# Patient Record
Sex: Female | Born: 1980 | Race: Black or African American | Hispanic: No | Smoking: Former smoker
Health system: Southern US, Community
[De-identification: ages and names within clinical notes are randomized; demographics above are authoritative.]

## PROBLEM LIST (undated history)

## (undated) HISTORY — PX: FOOT SURGERY: SHX648

## (undated) HISTORY — PX: OTHER SURGICAL HISTORY: SHX169

---

## 2000-06-26 ENCOUNTER — Ambulatory Visit (HOSPITAL_COMMUNITY): Admission: RE | Admit: 2000-06-26 | Discharge: 2000-06-26 | Payer: Self-pay | Admitting: *Deleted

## 2000-08-26 ENCOUNTER — Encounter: Payer: Self-pay | Admitting: Obstetrics

## 2000-08-26 ENCOUNTER — Inpatient Hospital Stay (HOSPITAL_COMMUNITY): Admission: AD | Admit: 2000-08-26 | Discharge: 2000-08-30 | Payer: Self-pay | Admitting: *Deleted

## 2001-05-13 ENCOUNTER — Emergency Department (HOSPITAL_COMMUNITY): Admission: EM | Admit: 2001-05-13 | Discharge: 2001-05-13 | Payer: Self-pay | Admitting: Emergency Medicine

## 2001-05-14 ENCOUNTER — Emergency Department (HOSPITAL_COMMUNITY): Admission: EM | Admit: 2001-05-14 | Discharge: 2001-05-14 | Payer: Self-pay | Admitting: Emergency Medicine

## 2006-10-05 ENCOUNTER — Emergency Department (HOSPITAL_COMMUNITY): Admission: EM | Admit: 2006-10-05 | Discharge: 2006-10-05 | Payer: Self-pay | Admitting: Emergency Medicine

## 2007-10-18 ENCOUNTER — Ambulatory Visit (HOSPITAL_COMMUNITY): Admission: RE | Admit: 2007-10-18 | Discharge: 2007-10-18 | Payer: Self-pay | Admitting: Obstetrics & Gynecology

## 2007-11-09 ENCOUNTER — Inpatient Hospital Stay (HOSPITAL_COMMUNITY): Admission: AD | Admit: 2007-11-09 | Discharge: 2007-11-11 | Payer: Self-pay | Admitting: Gynecology

## 2007-11-09 ENCOUNTER — Ambulatory Visit: Payer: Self-pay | Admitting: Obstetrics and Gynecology

## 2011-07-03 LAB — CBC
HCT: 36.2
Hemoglobin: 11.8 — ABNORMAL LOW
MCHC: 32.7
MCV: 78.7
Platelets: 256
RBC: 4.6
RDW: 14.8
WBC: 9.2

## 2011-10-03 ENCOUNTER — Other Ambulatory Visit: Payer: Self-pay | Admitting: Pediatrics

## 2012-02-10 ENCOUNTER — Encounter (HOSPITAL_COMMUNITY): Payer: Self-pay | Admitting: *Deleted

## 2012-02-10 ENCOUNTER — Emergency Department (HOSPITAL_COMMUNITY)
Admission: EM | Admit: 2012-02-10 | Discharge: 2012-02-10 | Disposition: A | Discharge status: Home / Self Care | Payer: Medicaid Other | Attending: Emergency Medicine | Admitting: Emergency Medicine

## 2012-02-10 DIAGNOSIS — R509 Fever, unspecified: Secondary | ICD-10-CM | POA: Insufficient documentation

## 2012-02-10 DIAGNOSIS — J020 Streptococcal pharyngitis: Secondary | ICD-10-CM

## 2012-02-10 DIAGNOSIS — F17200 Nicotine dependence, unspecified, uncomplicated: Secondary | ICD-10-CM | POA: Insufficient documentation

## 2012-02-10 MED ORDER — PENICILLIN V POTASSIUM 500 MG PO TABS: 500.0000 mg | ORAL_TABLET | Freq: Four times a day (QID) | ORAL | Status: AC

## 2012-02-10 MED ORDER — NAPROXEN 500 MG PO TABS: 500.0000 mg | ORAL_TABLET | Freq: Two times a day (BID) | ORAL | Status: DC

## 2012-02-10 NOTE — Discharge Instructions (Signed)
Plenty of fluids.   Follow up if not improving °

## 2012-02-10 NOTE — ED Provider Notes (Signed)
History     CSN: [id number]  Arrival date & time 02/10/12  1022   First MD Initiated Contact with Patient 02/10/12 1059      Chief Complaint  Patient presents with  . Fever    (Consider location/radiation/quality/duration/timing/severity/associated sxs/prior treatment) Patient is a 31 y.o. female presenting with fever. The history is provided by the patient (pt complains of a sorethroat). No language interpreter was used.  Fever Primary symptoms of the febrile illness include fever. Primary symptoms do not include fatigue, headaches, cough, abdominal pain, diarrhea or rash. The current episode started 2 days ago. This is a new problem. The problem has not changed since onset. Associated with: nothing. Risk factors: nothing.   History reviewed. No pertinent past medical history.  Past Surgical History  Procedure Date  . Foot surgery   . Cesearan section     No family history on file.  History  Substance Use Topics  . Smoking status: Current Some Day Smoker  . Smokeless tobacco: Not on file  . Alcohol Use: Yes     occ    OB History    Grav Para Term Preterm Abortions TAB SAB Ect Mult Living                  Review of Systems  Constitutional: Positive for fever. Negative for fatigue.  HENT: Positive for sore throat. Negative for congestion, sinus pressure and ear discharge.   Eyes: Negative for discharge.  Respiratory: Negative for cough.   Cardiovascular: Negative for chest pain.  Gastrointestinal: Negative for abdominal pain and diarrhea.  Genitourinary: Negative for frequency and hematuria.  Musculoskeletal: Negative for back pain.  Skin: Negative for rash.  Neurological: Negative for seizures and headaches.  Hematological: Negative.   Psychiatric/Behavioral: Negative for hallucinations.    Allergies  Review of patient's allergies indicates no known allergies.  Home Medications   Current Outpatient Rx  Name Route Sig Dispense Refill  . ACETAMINOPHEN  500 MG PO TABS Oral Take 500 mg by mouth every 6 (six) hours as needed. For fever    . NORETHIN ACE-ETH ESTRAD-FE 1-20 MG-MCG(24) PO TABS Oral Take 1 tablet by mouth daily at 12 noon.    Marland Kitchen NAPROXEN 500 MG PO TABS Oral Take 1 tablet (500 mg total) by mouth 2 (two) times daily. 14 tablet 0  . PENICILLIN V POTASSIUM 500 MG PO TABS Oral Take 1 tablet (500 mg total) by mouth 4 (four) times daily. 28 tablet 0    BP 114/73  Pulse 105  Temp(Src) 99.2 F (37.3 C) (Oral)  Resp 22  SpO2 100%  Physical Exam  Constitutional: She is oriented to person, place, and time. She appears well-developed.  HENT:  Head: Normocephalic and atraumatic.       pharnx mildly inflamed  Eyes: Conjunctivae and EOM are normal. No scleral icterus.  Neck: Neck supple. No thyromegaly present.  Cardiovascular: Normal rate and regular rhythm.  Exam reveals no gallop and no friction rub.   No murmur heard. Pulmonary/Chest: No stridor. She has no wheezes. She has no rales. She exhibits no tenderness.  Abdominal: She exhibits no distension. There is no tenderness. There is no rebound.  Musculoskeletal: Normal range of motion. She exhibits no edema.  Lymphadenopathy:    She has no cervical adenopathy.  Neurological: She is oriented to person, place, and time. Coordination normal.  Skin: No rash noted. No erythema.  Psychiatric: She has a normal mood and affect. Her behavior is normal.  ED Course  Procedures (including critical care time)  Labs Reviewed  RAPID STREP SCREEN - Abnormal; Notable for the following:    Streptococcus, Group A Screen (Direct) POSITIVE (*)    All other components within normal limits   No results found.   1. Strep pharyngitis       MDM          Benny Lennert, MD 02/10/12 [phone]

## 2012-02-10 NOTE — ED Notes (Signed)
Pt is here with temp 103 and she took one tylenol, sorethroat and body aches.

## 2012-02-10 NOTE — ED Notes (Signed)
Patient states sore throat 4/10 hurts to swallow airway intact bilateral equal chest rise and fall.  States general body achy.  Took one tablet tylenol prior to arrival unsure of dose.

## 2012-07-08 ENCOUNTER — Other Ambulatory Visit: Payer: Self-pay | Admitting: Family Medicine

## 2012-09-06 ENCOUNTER — Emergency Department (HOSPITAL_COMMUNITY)
Admission: EM | Admit: 2012-09-06 | Discharge: 2012-09-07 | Disposition: A | Discharge status: Home / Self Care | Payer: Medicaid Other | Attending: Emergency Medicine | Admitting: Emergency Medicine

## 2012-09-06 ENCOUNTER — Encounter (HOSPITAL_COMMUNITY): Payer: Self-pay | Admitting: *Deleted

## 2012-09-06 DIAGNOSIS — T7840XA Allergy, unspecified, initial encounter: Secondary | ICD-10-CM

## 2012-09-06 DIAGNOSIS — L272 Dermatitis due to ingested food: Secondary | ICD-10-CM | POA: Insufficient documentation

## 2012-09-06 DIAGNOSIS — R209 Unspecified disturbances of skin sensation: Secondary | ICD-10-CM | POA: Insufficient documentation

## 2012-09-06 DIAGNOSIS — F17200 Nicotine dependence, unspecified, uncomplicated: Secondary | ICD-10-CM | POA: Insufficient documentation

## 2012-09-06 NOTE — ED Notes (Signed)
The pt has had small hives since this am.  Tonight the hives have enlarged and spreading all over her body.  No resp diff

## 2012-09-07 MED ORDER — DIPHENHYDRAMINE HCL 25 MG PO TABS: 25.0000 mg | ORAL_TABLET | Freq: Four times a day (QID) | ORAL | Status: AC

## 2012-09-07 MED ORDER — FAMOTIDINE 20 MG PO TABS
40.0000 mg | ORAL_TABLET | Freq: Once | ORAL | Status: AC
  Administered 2012-09-07: 40 mg via ORAL
  Filled 2012-09-07: qty 2

## 2012-09-07 MED ORDER — DIPHENHYDRAMINE HCL 25 MG PO CAPS
25.0000 mg | ORAL_CAPSULE | Freq: Once | ORAL | Status: AC
  Administered 2012-09-07: 25 mg via ORAL
  Filled 2012-09-07: qty 1

## 2012-09-07 MED ORDER — PREDNISONE 20 MG PO TABS
60.0000 mg | ORAL_TABLET | Freq: Once | ORAL | Status: AC
  Administered 2012-09-07: 60 mg via ORAL
  Filled 2012-09-07: qty 3

## 2012-09-07 MED ORDER — FAMOTIDINE 20 MG PO TABS: 20.0000 mg | ORAL_TABLET | Freq: Two times a day (BID) | ORAL | Status: DC

## 2012-09-07 MED ORDER — HYDROCORTISONE 1 % EX CREA: TOPICAL_CREAM | CUTANEOUS | Status: DC

## 2012-09-07 NOTE — ED Provider Notes (Signed)
History     CSN: [id number]  Arrival date & time 09/06/12  2325   First MD Initiated Contact with Patient 09/07/12 0010      Chief Complaint  Patient presents with  . Urticaria    (Consider location/radiation/quality/duration/timing/severity/associated sxs/prior treatment) HPI  31 year old female presents for evaluations of hives. Patient reports this morning she noticed a small hives on her right forearm, itchy, red.  Throughout the day she noticed more hives on her body.  Hives appears on her finger, her upper chest, and also on her back.  She denies fever, headache, throat swelling, cp, sob, or n/v/d.  She recall having hives when eating tomato in the past.  She did eat tomato last night and spaghetti today with tomato sauce.  She denies any medication changes, changes in soap, detergent, new pets, or insect bite.  No other significant hx of allergy.  No treatment tried.    History reviewed. No pertinent past medical history.  Past Surgical History  Procedure Date  . Foot surgery   . Cesearan section     No family history on file.  History  Substance Use Topics  . Smoking status: Current Some Day Smoker  . Smokeless tobacco: Not on file  . Alcohol Use: Yes     Comment: occ    OB History    Grav Para Term Preterm Abortions TAB SAB Ect Mult Living                  Review of Systems  Constitutional: Negative for fever.  HENT: Negative for trouble swallowing.   Respiratory: Negative for chest tightness and shortness of breath.   Cardiovascular: Negative for chest pain and palpitations.  Gastrointestinal: Negative for abdominal pain.  Skin: Positive for rash (hives).  Neurological: Negative for numbness.    Allergies  Review of patient's allergies indicates no known allergies.  Home Medications   Current Outpatient Rx  Name  Route  Sig  Dispense  Refill  . ACETAMINOPHEN 500 MG PO TABS   Oral   Take 500 mg by mouth every 6 (six) hours as needed. For fever         . NAPROXEN 500 MG PO TABS   Oral   Take 1 tablet (500 mg total) by mouth 2 (two) times daily.   14 tablet   0   . NORETHIN ACE-ETH ESTRAD-FE 1-20 MG-MCG(24) PO TABS   Oral   Take 1 tablet by mouth daily at 12 noon.           BP 118/69  Pulse 74  Temp 98.6 F (37 C) (Oral)  Resp 18  SpO2 98%  Physical Exam  Nursing note and vitals reviewed. Constitutional: She is oriented to person, place, and time. She appears well-developed and well-nourished. No distress.  HENT:  Head: Atraumatic.  Mouth/Throat: Oropharynx is clear and moist.       No mucosa edema, no airway obstruction  Eyes: Conjunctivae normal are normal.  Neck: Normal range of motion. Neck supple.  Cardiovascular: Normal rate and regular rhythm.   Pulmonary/Chest: Effort normal and breath sounds normal. No respiratory distress. She has no wheezes. She exhibits no tenderness.  Abdominal: Soft. Bowel sounds are normal. There is no tenderness.  Musculoskeletal: Normal range of motion. She exhibits no edema.  Neurological: She is alert and oriented to person, place, and time.  Skin:       Circular area of erythema, induration and warmth suggestive of hives (R fingers, R forearm,  L forearm, upper chest, upper back).  No pustular, petechial, or vesicular lesions.   Psychiatric: She has a normal mood and affect.    ED Course  Procedures (including critical care time)  Labs Reviewed - No data to display No results found.   No diagnosis found.  1. Allergic reaction  MDM  Pt presents with skin changes suggestive of hives.  She has similar reaction from eating tomato before but not this significant.  She admits to eating tomato today.  Unsure if this is related to allergy to tomato.  No airway compromise, no signs of infection.  Doubt insect bite.    Prednisone/Benadryl/Pepcid given.  Pt recommend to f/u with PCP and with allergist for recheck and to determine if she is indeed allergic to tomato.  Strict  return precaution discussed.    12:43 AM Pt felt a bit better.  Stable to be discharge.    BP 118/69  Pulse 74  Temp 98.6 F (37 C) (Oral)  Resp 18  SpO2 98%       Fayrene Helper, PA-C 09/07/12 [phone]

## 2012-09-07 NOTE — ED Provider Notes (Signed)
Medical screening examination/treatment/procedure(s) were performed by non-physician practitioner and as supervising physician I was immediately available for consultation/collaboration.  Olivia Mackie, MD 09/07/12 [phone]

## 2014-10-26 ENCOUNTER — Encounter (HOSPITAL_COMMUNITY): Payer: Self-pay | Admitting: Emergency Medicine

## 2014-10-26 ENCOUNTER — Emergency Department (INDEPENDENT_AMBULATORY_CARE_PROVIDER_SITE_OTHER)
Admission: EM | Admit: 2014-10-26 | Discharge: 2014-10-26 | Disposition: A | Discharge status: Home / Self Care | Payer: Self-pay | Source: Home / Self Care | Attending: Family Medicine | Admitting: Family Medicine

## 2014-10-26 DIAGNOSIS — Z9109 Other allergy status, other than to drugs and biological substances: Secondary | ICD-10-CM

## 2014-10-26 DIAGNOSIS — J329 Chronic sinusitis, unspecified: Secondary | ICD-10-CM

## 2014-10-26 DIAGNOSIS — Z91048 Other nonmedicinal substance allergy status: Secondary | ICD-10-CM

## 2014-10-26 MED ORDER — IPRATROPIUM BROMIDE 0.06 % NA SOLN
2.0000 | Freq: Four times a day (QID) | NASAL | Status: DC
Start: 2014-10-26 — End: 2018-06-16

## 2014-10-26 MED ORDER — AMOXICILLIN-POT CLAVULANATE 875-125 MG PO TABS: 1.0000 | ORAL_TABLET | Freq: Two times a day (BID) | ORAL | Status: DC

## 2014-10-26 MED ORDER — FLUTICASONE PROPIONATE 50 MCG/ACT NA SUSP: 2.0000 | Freq: Every day | NASAL | Status: DC

## 2014-10-26 NOTE — ED Provider Notes (Signed)
CSN: [id number]     Arrival date & time 10/26/14  1109 History   First MD Initiated Contact with Patient 10/26/14 1136     Chief Complaint  Patient presents with  . Cough   (Consider location/radiation/quality/duration/timing/severity/associated sxs/prior Treatment) HPI   Cough started 7 days ago. Associated w/ runny nose. H/o sinus and allergy "problems." worse at night and first thing in the morning. Minimal mucus production. OTC cough medicine and tea w/o much benefit. Denies CP, SOb, fever, facial pain. HAs. Hurst to cough. 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 Decreased activity during this time due to decreased energy. [place]/ nausea and fevers and cold symptoms. Not getting better or worse.  Pt [facility] w/ benefit.    History reviewed. No pertinent past medical history. Past Surgical History  Procedure Laterality Date  . Foot surgery    . Cesearan section     Family History  Problem Relation Age of Onset  . Hypertension Father   . Thyroid disease Sister   . Hyperlipidemia Sister   . Diabetes Maternal Grandmother    History  Substance Use Topics  . Smoking status: Former Smoker    Quit date: 10/26/2013  . Smokeless tobacco: Not on file  . Alcohol Use: Yes     Comment: occ   OB History    No data available     Review of Systems Per HPI with all other pertinent systems negative.   Per HPI with all other pertinent systems negative.   Allergies  Review of patient's allergies indicates no known allergies.  Home Medications   Prior to Admission medications   Medication Sig Start Date End Date Taking? Authorizing Provider  norethindrone-ethinyl estradiol (JUNEL FE,GILDESS FE,LOESTRIN FE) 1-20 MG-MCG tablet Take 1 tablet by mouth daily.   Yes Historical Provider, MD  amoxicillin-clavulanate (AUGMENTIN) 875-125 MG per tablet Take 1 tablet by mouth 2 (two) times daily. 10/26/14   Ozella Rocksavid J Hallel Denherder, MD  diphenhydrAMINE (BENADRYL) 25 MG  tablet Take 1 tablet (25 mg total) by mouth every 6 (six) hours. 09/07/12   Fayrene HelperBowie Tran, PA-C  famotidine (PEPCID) 20 MG tablet Take 1 tablet (20 mg total) by mouth 2 (two) times daily. 09/07/12   Fayrene HelperBowie Tran, PA-C  fluticasone (FLONASE) 50 MCG/ACT nasal spray Place 2 sprays into both nostrils at bedtime. 10/26/14   Ozella Rocksavid J Laderius Valbuena, MD  hydrocortisone cream 1 % Apply to affected area 2 times daily 09/07/12   Fayrene HelperBowie Tran, PA-C  ibuprofen (ADVIL,MOTRIN) 200 MG tablet Take 400 mg by mouth every 6 (six) hours as needed. For pain    Historical Provider, MD  ipratropium (ATROVENT) 0.06 % nasal spray Place 2 sprays into both nostrils 4 (four) times daily. 10/26/14   Ozella Rocksavid J Arloa Prak, MD   BP 122/77 mmHg  Pulse 90  Temp(Src) 98.3 F (36.8 C) (Oral)  Resp 16  SpO2 100%  LMP 10/24/2014 (Exact Date) Physical Exam  Constitutional: She is oriented to person, place, and time. She appears well-developed and well-nourished.  HENT:  Tonsils 0+ Nasal congestion  Eyes: EOM are normal. Pupils are equal, round, and reactive to light.  Neck: Normal range of motion.  Cardiovascular: Normal rate and normal heart sounds.   Pulmonary/Chest: Effort normal and breath sounds normal.  Abdominal: Soft. Bowel sounds are normal.  Musculoskeletal: She exhibits no tenderness.  Neurological: She is alert and oriented to person, place, and time. No cranial nerve deficit. Coordination normal.  Skin: Skin is  warm.  Psychiatric: She has a normal mood and affect. Her behavior is normal. Judgment and thought content normal.    ED Course  Procedures (including critical care time) Labs Review Labs Reviewed - No data to display  Imaging Review No results found.   MDM   1. Rhinosinusitis   2. Multiple environmental allergies    Start nasal atrovent and flonase, OTC allergy medicine such as zyrtec, and ibuprofen Start Augmentin if symptoms persist more than a couple more days or develops fevers.  Precautions given and all  questions answered  Shelly Flatten, MD Family Medicine 10/26/2014, 11:52 AM      Ozella Rocks, MD 10/26/14 [phone]

## 2014-10-26 NOTE — ED Notes (Signed)
Pt complains of a cough that started January 8.  Due to the coughing she finds herself weak with activity and short of breath.  It has also caused aching in her back, shoulders, chest & head.  She reports a bad cold with a low grade fever over Christmas that went away for about a week.

## 2014-10-26 NOTE — Discharge Instructions (Signed)
Your symptoms are likely a combination of infection and allergies Please start the atrovent and flonase Consider starting an allergy pill and ibuprofen 400-600mg  every 4-6 hours Please start the antibiotics only if the above therapies do not improve your symtpoms over the next couple of days or if you develop worsening symtpoms with fevers.

## 2018-06-16 ENCOUNTER — Other Ambulatory Visit: Payer: Self-pay

## 2018-06-16 ENCOUNTER — Ambulatory Visit (HOSPITAL_COMMUNITY)
Admission: EM | Admit: 2018-06-16 | Discharge: 2018-06-16 | Disposition: A | Discharge status: Home / Self Care | Payer: Self-pay | Attending: Family Medicine | Admitting: Family Medicine

## 2018-06-16 ENCOUNTER — Encounter (HOSPITAL_COMMUNITY): Payer: Self-pay | Admitting: Emergency Medicine

## 2018-06-16 DIAGNOSIS — M79642 Pain in left hand: Secondary | ICD-10-CM

## 2018-06-16 DIAGNOSIS — R2232 Localized swelling, mass and lump, left upper limb: Secondary | ICD-10-CM

## 2018-06-16 DIAGNOSIS — T7840XA Allergy, unspecified, initial encounter: Secondary | ICD-10-CM

## 2018-06-16 MED ORDER — PREDNISONE 20 MG PO TABS: 40.0000 mg | ORAL_TABLET | Freq: Every day | ORAL | 0 refills | Status: AC

## 2018-06-16 NOTE — ED Triage Notes (Signed)
On Monday night/tuesday morning was asleep and woke with hand itching.  As Tuesday progressed left hand was itching, swollen, and red.  Most itching is in hand on the side of little finger.  Left hand is swollen and painful and red extending to above left wrist

## 2018-06-16 NOTE — Discharge Instructions (Signed)
5 days of oral prednisone to help with this response.  Daily zyrtec 10mg .  You  may use triamcinolone cream twice a day as needed for itching, apply primarily to left side of hand at point of suspected bite.  Ice, elevation to help with swelling.  If symptoms worsen or do not improve in the next week to return to be seen or to follow up with your PCP.

## 2018-06-16 NOTE — ED Provider Notes (Signed)
[facility]    CSN: [id number] Arrival date & time: 06/16/18  6295     History   Chief Complaint Chief Complaint  Patient presents with  . Insect Bite    HPI Lindsay Terry is a 37 y.o. female.   Lindsay Terry presents with complaints of left hand swelling, redness and itching. She woke with this yesterday morning, she had been sleeping on her couch. This morning redness, swelling and itching are more severe. No numbness or tingling. No pain. No known injury. Suspects bite as there is a raised area to left lateral pinky and symptoms have been focused surrounding this area. Swelling has extended to entire hand today however. Took one benadryl yesterday and has applied ice and kenalog cream which have minimally helped. No injury. Did not witness a bug bite specifically. She is right handed. She has had a plate and screws to her left wrist.    ROS per HPI.      History reviewed. No pertinent past medical history.  There are no active problems to display for this patient.   Past Surgical History:  Procedure Laterality Date  . arm surgery    . cesearan section    . FOOT SURGERY      OB History   None      Home Medications    Prior to Admission medications   Medication Sig Start Date End Date Taking? Authorizing Provider  diphenhydrAMINE (BENADRYL) 25 MG tablet Take 1 tablet (25 mg total) by mouth every 6 (six) hours. 09/07/12  Yes Fayrene Helper, PA-C  ibuprofen (ADVIL,MOTRIN) 200 MG tablet Take 400 mg by mouth every 6 (six) hours as needed. For pain    [provider]  norethindrone-ethinyl estradiol (JUNEL FE,GILDESS FE,LOESTRIN FE) 1-20 MG-MCG tablet Take 1 tablet by mouth daily.    [provider]  predniSONE (DELTASONE) 20 MG tablet Take 2 tablets (40 mg total) by mouth daily with breakfast for 5 days. 06/16/18 06/21/18  Georgetta Haber, NP    Family History Family History  Problem Relation Age of Onset  . Hypertension Father   . Thyroid  disease Sister   . Hyperlipidemia Sister   . Diabetes Maternal Grandmother     Social History Social History   Tobacco Use  . Smoking status: Former Smoker    Last attempt to quit: 10/26/2013    Years since quitting: 4.6  Substance Use Topics  . Alcohol use: Yes    Comment: occ  . Drug use: No     Allergies   Patient has no known allergies.   Review of Systems Review of Systems   Physical Exam Triage Vital Signs ED Triage Vitals  Enc Vitals Group     BP 06/16/18 0852 124/87     Pulse Rate 06/16/18 0852 79     Resp 06/16/18 0852 16     Temp 06/16/18 0852 98.2 F (36.8 C)     Temp Source 06/16/18 0852 Oral     SpO2 06/16/18 0852 98 %     Weight --      Height --      Head Circumference --      Peak Flow --      Pain Score 06/16/18 0903 0     Pain Loc --      Pain Edu? --      Excl. in GC? --    No data found.  Updated Vital Signs BP 124/87 (BP Location: Left Arm)  Pulse 79   Temp 98.2 F (36.8 C) (Oral)   Resp 16   LMP 05/23/2018   SpO2 98%    Physical Exam  Constitutional: She is oriented to person, place, and time. She appears well-developed and well-nourished. No distress.  HENT:  Mouth/Throat: Oropharynx is clear and moist.  Cardiovascular: Normal rate, regular rhythm and normal heart sounds.  Pulmonary/Chest: Effort normal and breath sounds normal. She has no wheezes.  Musculoskeletal:       Left wrist: Normal.       Left hand: She exhibits swelling. She exhibits normal range of motion, no tenderness, no bony tenderness, normal two-point discrimination, normal capillary refill, no deformity and no laceration. Normal sensation noted. Normal strength noted.       Hands: Raised area to left proximal pinky noted; left hand grossly swollen and with redness and warm; generalized; non tender; full ROM to wrist; fingers to fist slightly limited by swelling to proximal phalanges; gross sensation intact; strong pulse; cap refill < 2 seconds      Neurological: She is alert and oriented to person, place, and time.  Skin: Skin is warm and dry.     UC Treatments / Results  Labs (all labs ordered are listed, but only abnormal results are displayed) Labs Reviewed - No data to display  EKG None  Radiology No results found.  Procedures Procedures (including critical care time)  Medications Ordered in UC Medications - No data to display  Initial Impression / Assessment and Plan / UC Course  I have reviewed the triage vital signs and the nursing notes.  Pertinent labs & imaging results that were available during my care of the patient were reviewed by me and considered in my medical decision making (see chart for details).     Afebrile. No pain. Full ROM. Swelling redness and predominately itching to left hand. Appears consistent with insect bite and reaction. Prednisone course provided. Encouraged continued use of antihistamine, elevation, ice as well. Return precautions provided. Patient verbalized understanding and agreeable to plan.   Final Clinical Impressions(s) / UC Diagnoses   Final diagnoses:  Allergic reaction, initial encounter     Discharge Instructions     5 days of oral prednisone to help with this response.  Daily zyrtec 10mg .  You  may use triamcinolone cream twice a day as needed for itching, apply primarily to left side of hand at point of suspected bite.  Ice, elevation to help with swelling.  If symptoms worsen or do not improve in the next week to return to be seen or to follow up with your PCP.      ED Prescriptions    Medication Sig Dispense Auth. Provider   predniSONE (DELTASONE) 20 MG tablet Take 2 tablets (40 mg total) by mouth daily with breakfast for 5 days. 10 tablet Georgetta Haber, NP     Controlled Substance Prescriptions [place] Controlled Substance Registry consulted? Not Applicable   Georgetta Haber, NP 06/16/18 [phone]

## 2018-06-23 ENCOUNTER — Telehealth (HOSPITAL_COMMUNITY): Payer: Self-pay | Admitting: *Deleted

## 2018-06-23 NOTE — Telephone Encounter (Signed)
Telephoned patient at home number and left message to return call to [facility] 

## 2018-06-25 ENCOUNTER — Other Ambulatory Visit (HOSPITAL_COMMUNITY): Payer: Self-pay | Admitting: *Deleted

## 2018-06-25 ENCOUNTER — Encounter (HOSPITAL_COMMUNITY): Payer: Self-pay | Admitting: *Deleted

## 2018-06-25 DIAGNOSIS — N6320 Unspecified lump in the left breast, unspecified quadrant: Secondary | ICD-10-CM

## 2018-07-22 ENCOUNTER — Ambulatory Visit (HOSPITAL_COMMUNITY)
Admission: RE | Admit: 2018-07-22 | Discharge: 2018-07-22 | Disposition: A | Discharge status: Home / Self Care | Payer: Self-pay | Source: Ambulatory Visit | Attending: Obstetrics and Gynecology | Admitting: Obstetrics and Gynecology

## 2018-07-22 ENCOUNTER — Ambulatory Visit
Admission: RE | Admit: 2018-07-22 | Discharge: 2018-07-22 | Disposition: A | Discharge status: Home / Self Care | Payer: PRIVATE HEALTH INSURANCE | Source: Ambulatory Visit | Attending: Obstetrics and Gynecology | Admitting: Obstetrics and Gynecology

## 2018-07-22 ENCOUNTER — Encounter (HOSPITAL_COMMUNITY): Payer: Self-pay

## 2018-07-22 ENCOUNTER — Ambulatory Visit
Admission: RE | Admit: 2018-07-22 | Discharge: 2018-07-22 | Disposition: A | Discharge status: Home / Self Care | Payer: Self-pay | Source: Ambulatory Visit | Attending: Obstetrics and Gynecology | Admitting: Obstetrics and Gynecology

## 2018-07-22 VITALS — BP 122/84 | Wt 221.6 lb

## 2018-07-22 DIAGNOSIS — N6320 Unspecified lump in the left breast, unspecified quadrant: Secondary | ICD-10-CM

## 2018-07-22 DIAGNOSIS — N6324 Unspecified lump in the left breast, lower inner quadrant: Secondary | ICD-10-CM

## 2018-07-22 DIAGNOSIS — Z1239 Encounter for other screening for malignant neoplasm of breast: Secondary | ICD-10-CM

## 2018-07-22 NOTE — Progress Notes (Signed)
Patient referred to [facility] by the [facility] for a left breast lump x 2 months that patient states feels different.  Pap Smear: Pap smear not completed today. Last Pap smear was 05/11/2018 at the [facility] and normal. Per patient has no history of an abnormal Pap smear. Last Pap smear result is in Epic.  Physical exam: Breasts Breasts symmetrical. No skin abnormalities bilateral breasts. No nipple retraction bilateral breasts. No nipple discharge bilateral breasts. No lymphadenopathy. No lumps palpated right breast. Palpated a pea sized lump within the left breast at 7 o'clock 7 cm from the nipple. No complaints of pain or tenderness on exam. Referred patient to the [facility] for a diagnostic mammogram and left breast ultrasound. Appointment scheduled for Thursday, July 22, 2018 at 1410.        Pelvic/Bimanual No Pap smear completed today since last Pap smear was 05/11/2018. Pap smear not indicated per [facility] guidelines.   Smoking History: Patient is a former smoker that quit in 2015.  Patient Navigation: Patient education provided. Access to services provided for patient through [facility] program.   Breast and Cervical Cancer Risk Assessment: Patient has no family history of breast cancer, known genetic mutations, or radiation treatment to the chest before age 21. Patient has no history of cervical dysplasia, immunocompromised, or DES exposure in-utero.  Risk Assessment    Risk Scores      07/22/2018   Last edited by: Lynnell Dike, LPN   5-year risk: 0.4 %   Lifetime risk: 9.8 %

## 2018-07-22 NOTE — Patient Instructions (Signed)
Explained breast self awareness with Mikalyn A Delfino. Patient did not need a Pap smear today due to last Pap smear was 05/11/2018. Let her know [facility] will cover Pap smears every 3 years unless has a history of abnormal Pap smears. Referred patient to the [facility] for a diagnostic mammogram and left breast ultrasound. Appointment scheduled for Thursday, July 22, 2018 at 1410. Viola A Yamaguchi verbalized understanding.  Ahmon Tosi, Kathaleen Maser, RN 2:35 PM

## 2018-08-11 ENCOUNTER — Encounter (HOSPITAL_COMMUNITY): Payer: Self-pay | Admitting: *Deleted

## 2020-05-08 ENCOUNTER — Telehealth: Payer: Self-pay | Admitting: Family Medicine

## 2020-05-08 NOTE — Telephone Encounter (Signed)
Attempted to reach patient to schedule an appointment.

## 2020-08-27 ENCOUNTER — Telehealth: Payer: Self-pay

## 2020-08-27 NOTE — Telephone Encounter (Signed)
Patient left message on voicemail requesting return call regarding scheduling a [facility] appointment. Attempted to contact patient, left message on voicemail requesting a return call.

## 2021-03-20 ENCOUNTER — Other Ambulatory Visit: Payer: Self-pay | Admitting: Family Medicine

## 2021-03-20 DIAGNOSIS — Z1231 Encounter for screening mammogram for malignant neoplasm of breast: Secondary | ICD-10-CM

## 2021-05-08 ENCOUNTER — Other Ambulatory Visit: Payer: Self-pay | Admitting: Family Medicine

## 2021-05-08 DIAGNOSIS — N6320 Unspecified lump in the left breast, unspecified quadrant: Secondary | ICD-10-CM

## 2021-06-01 ENCOUNTER — Encounter: Payer: Self-pay | Admitting: Radiology

## 2021-06-12 ENCOUNTER — Other Ambulatory Visit: Payer: PRIVATE HEALTH INSURANCE

## 2021-07-12 ENCOUNTER — Ambulatory Visit
Admission: RE | Admit: 2021-07-12 | Discharge: 2021-07-12 | Disposition: A | Discharge status: Home / Self Care | Payer: PRIVATE HEALTH INSURANCE | Source: Ambulatory Visit | Attending: Family Medicine | Admitting: Family Medicine

## 2021-07-12 ENCOUNTER — Ambulatory Visit
Admission: RE | Admit: 2021-07-12 | Discharge: 2021-07-12 | Disposition: A | Discharge status: Home / Self Care | Payer: Medicaid Other | Source: Ambulatory Visit | Attending: Family Medicine | Admitting: Family Medicine

## 2021-07-12 ENCOUNTER — Other Ambulatory Visit: Payer: Self-pay

## 2021-07-12 DIAGNOSIS — N6320 Unspecified lump in the left breast, unspecified quadrant: Secondary | ICD-10-CM

## 2022-06-24 ENCOUNTER — Other Ambulatory Visit: Payer: Self-pay | Admitting: Family Medicine

## 2022-06-24 DIAGNOSIS — Z1231 Encounter for screening mammogram for malignant neoplasm of breast: Secondary | ICD-10-CM

## 2022-07-15 ENCOUNTER — Ambulatory Visit
Admission: RE | Admit: 2022-07-15 | Discharge: 2022-07-15 | Disposition: A | Discharge status: Home / Self Care | Payer: Medicaid Other | Source: Ambulatory Visit | Attending: Family Medicine | Admitting: Family Medicine

## 2022-07-15 DIAGNOSIS — Z1231 Encounter for screening mammogram for malignant neoplasm of breast: Secondary | ICD-10-CM

## 2023-03-17 IMAGING — MG DIGITAL DIAGNOSTIC BILAT W/ TOMO W/ CAD
6 of 10 series · 6 of 30 positions shown · non-contrast
Comparison: Mammography 07/22/2018.

CLINICAL DATA: 39-year-old presenting with a possible pea-sized
palpable lump at the 7-8 o'clock position of the LEFT breast on
recent clinical examination. The patient states that she does not
feel the lump. Annual evaluation, RIGHT breast.

She states that she fell onto a hardwood floor proximally 2 years
ago and injured the inner LEFT breast.
EXAM:
DIGITAL DIAGNOSTIC BILATERAL MAMMOGRAM WITH TOMOSYNTHESIS AND CAD;
ULTRASOUND LEFT BREAST LIMITED
TECHNIQUE: Bilateral digital diagnostic mammography and breast tomosynthesis
was performed. The images were evaluated with computer-aided
detection.; Targeted ultrasound examination of the left breast was
performed.

[R MLO synth-2D]
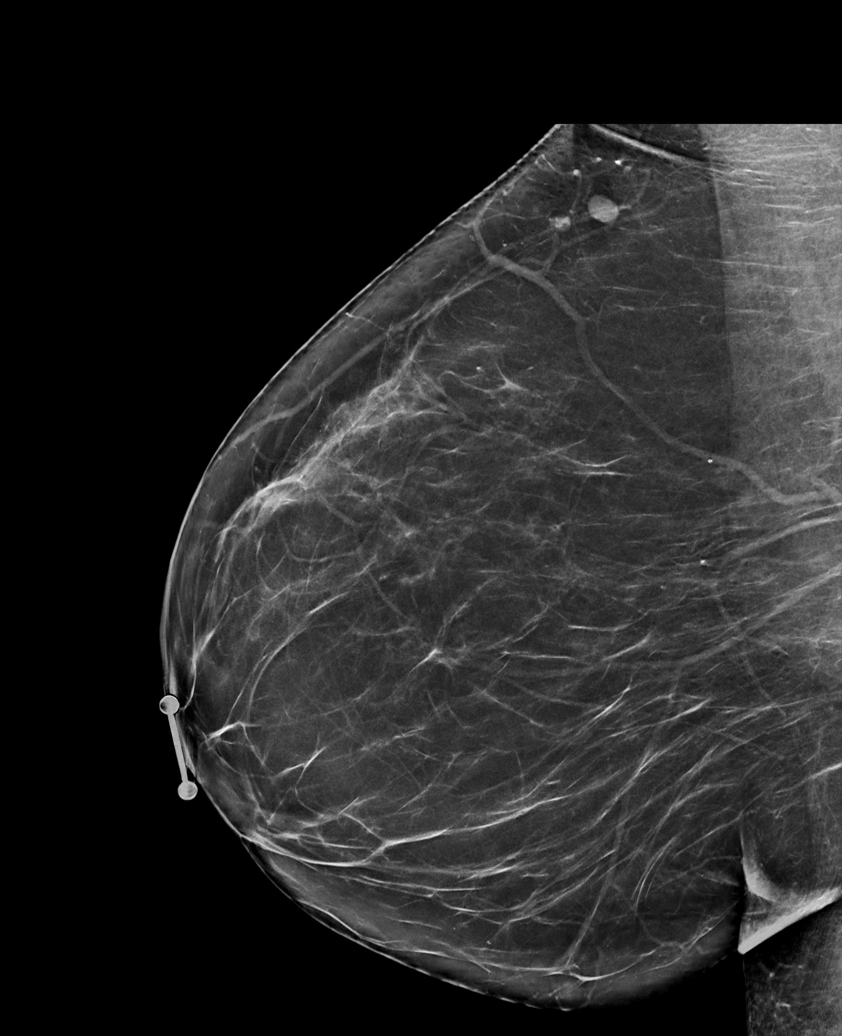

[L CC synth-2D]
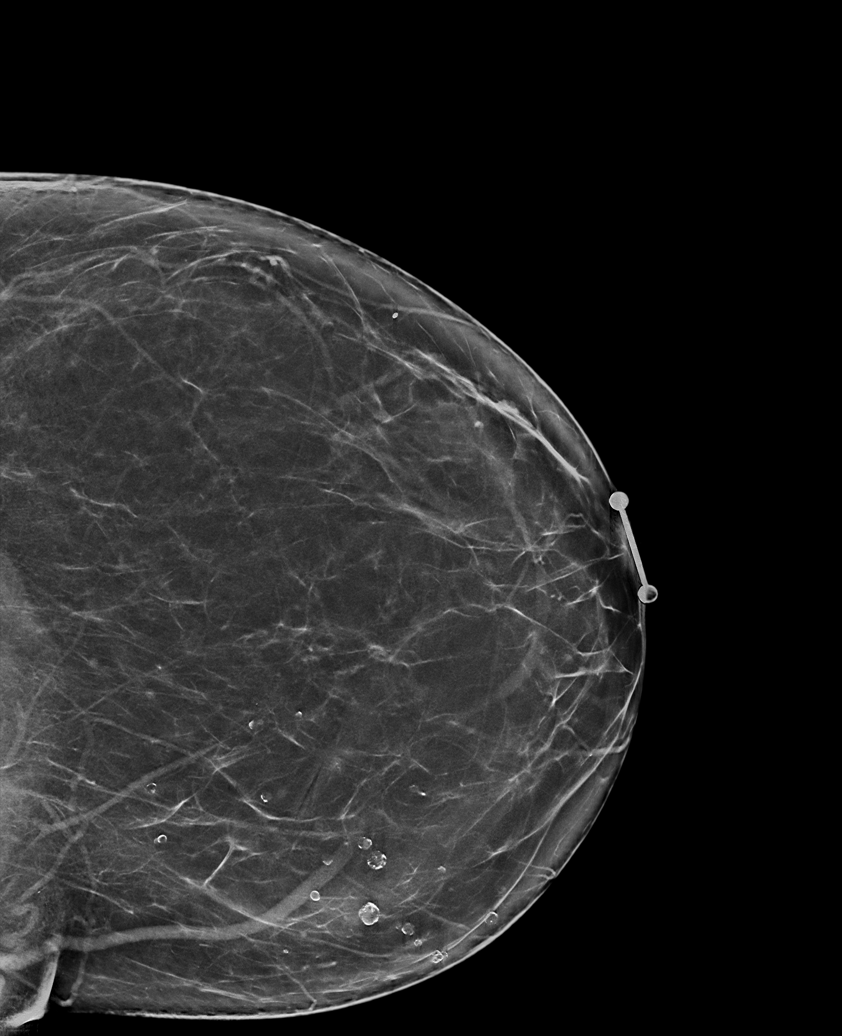

[L MLO synth-2D]
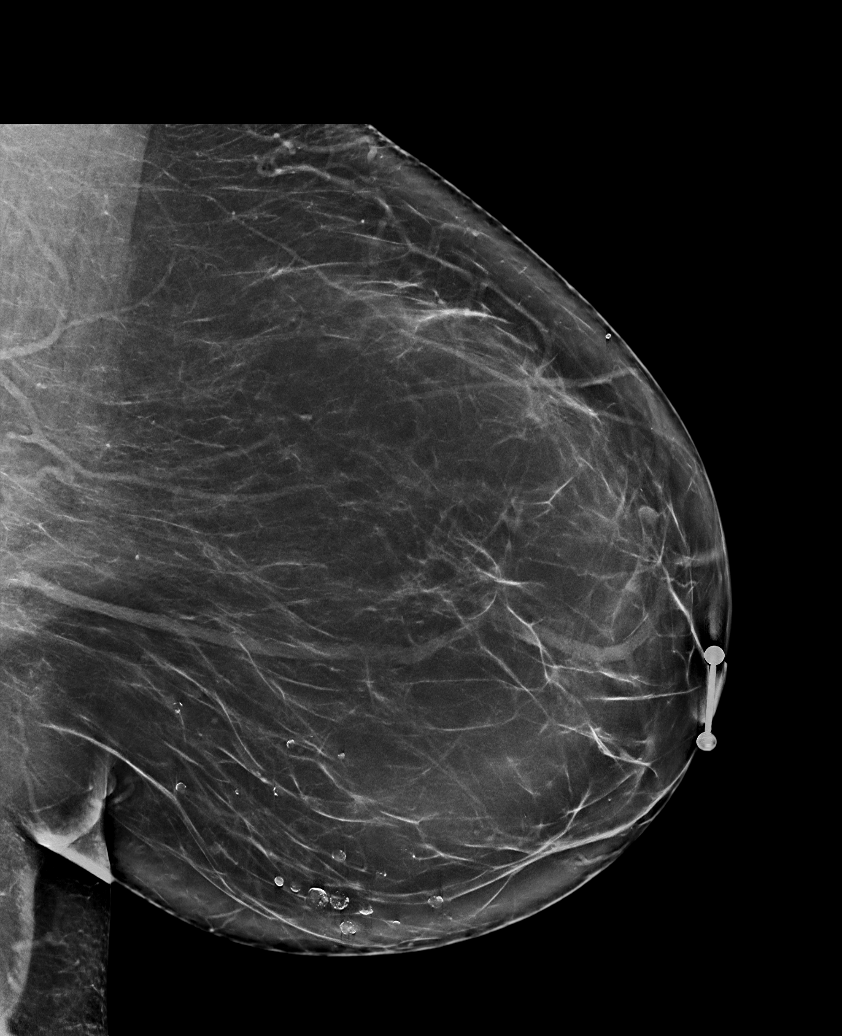

[R CC synth-2D (1 of 2)]
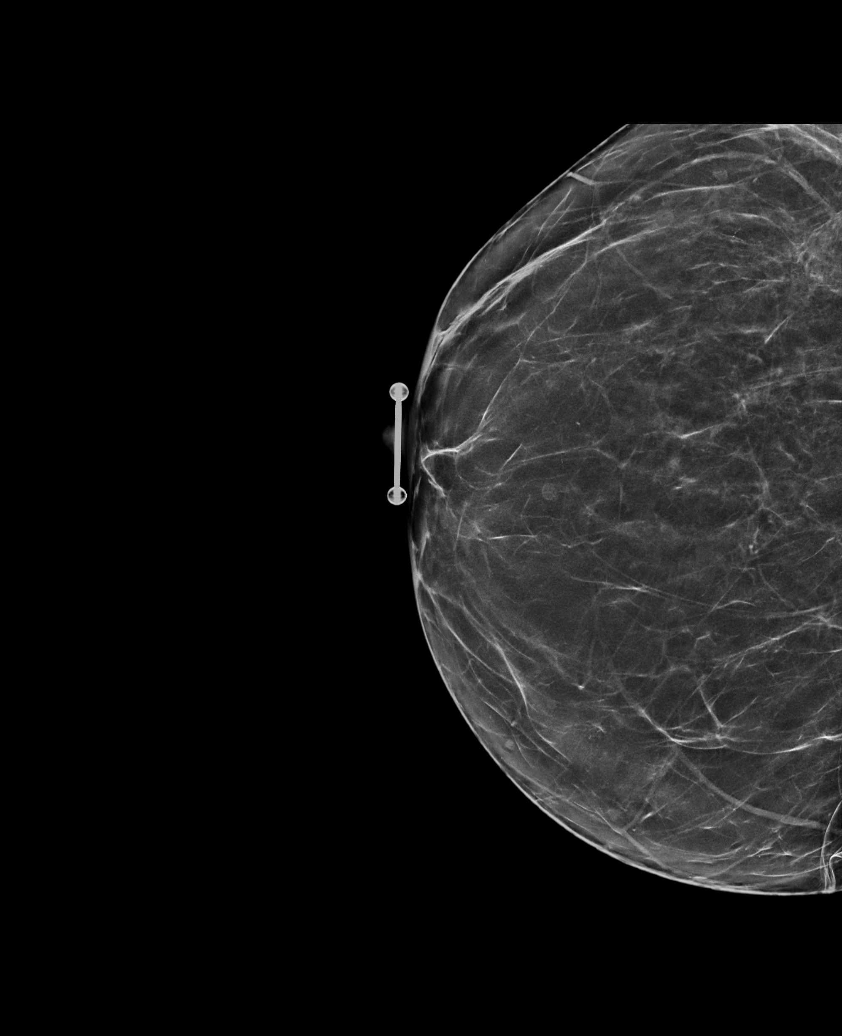

[R CC synth-2D (2 of 2)]
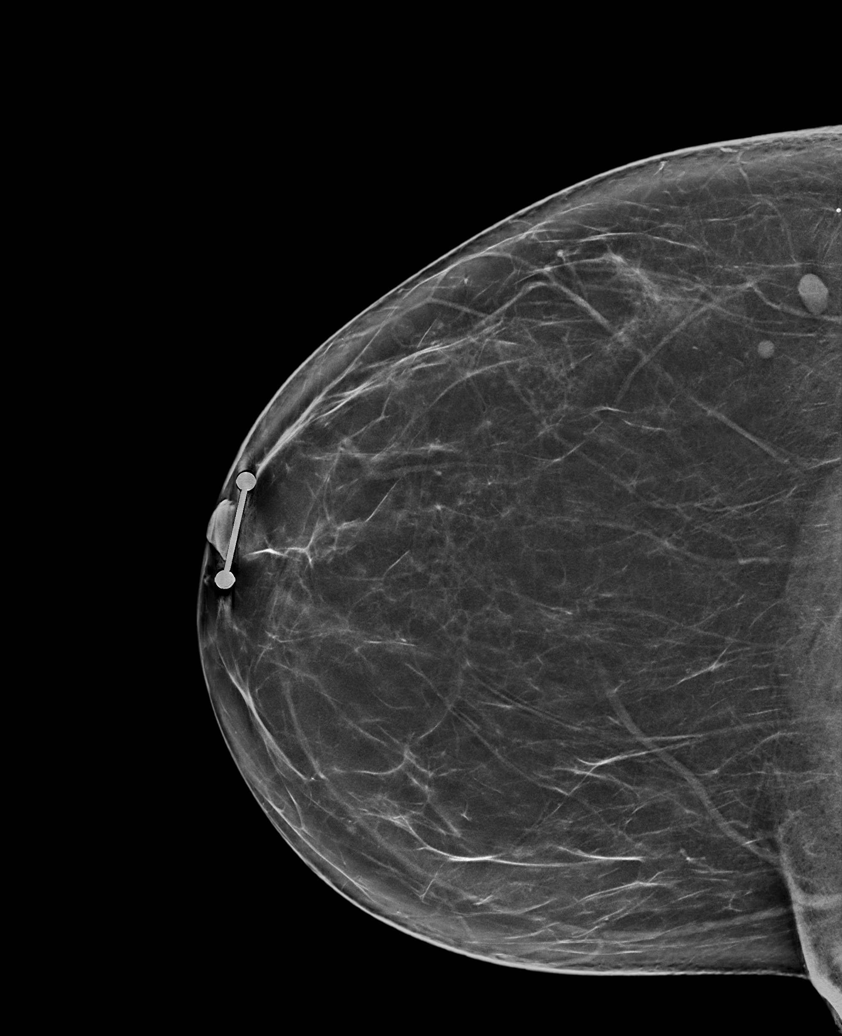

[R MLO tomo · tomo slice 42/83.0]
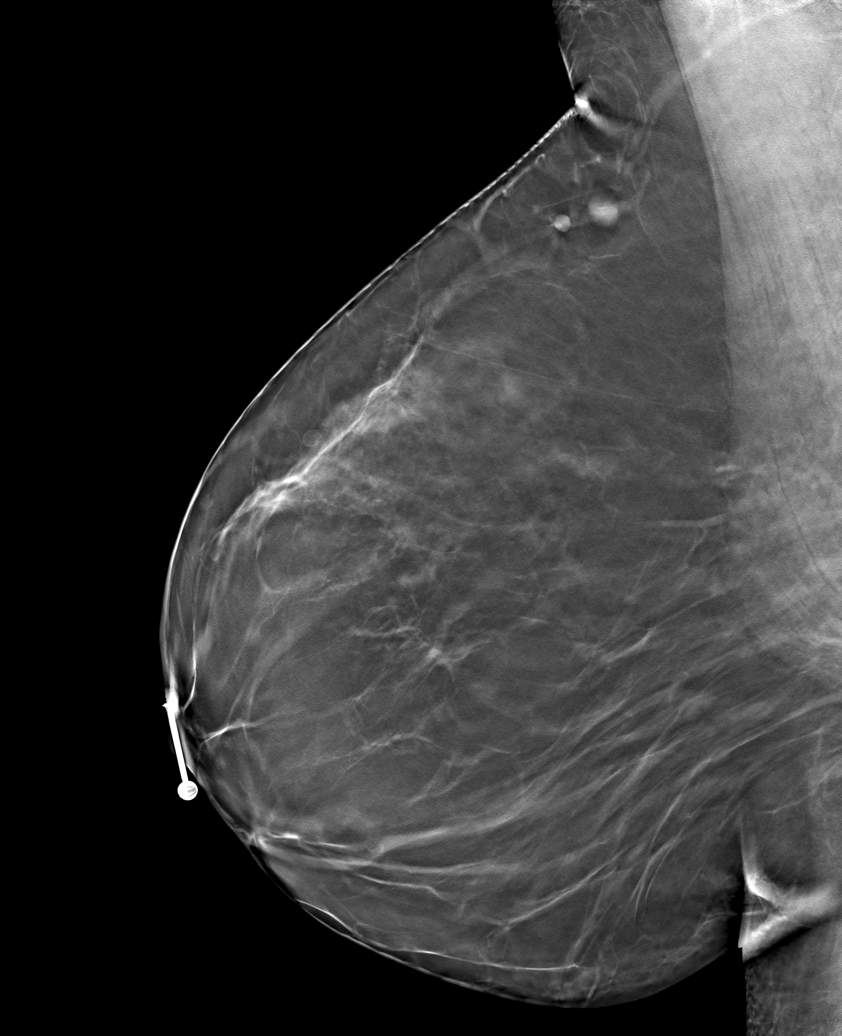

[6 of 30 positions shown; findings below may reference images not displayed]

LEFT breast ultrasound at that
time was performed in a different part of the breast.

ACR Breast Density Category b: There are scattered areas of
fibroglandular density.
FINDINGS: Full field CC and MLO views of both breasts were obtained.

RIGHT: No findings suspicious for malignancy.

LEFT: No findings suspicious for malignancy. Numerous benign
partially calcified oil cysts throughout the lower inner breast
which may account for the palpable concern.

Targeted ultrasound is performed, demonstrating multiple
circumscribed anechoic masses with internal echoes, demonstrating
mixed posterior characteristics and no internal power Doppler flow
at the 7-7:30 o'clock position 4 cm from the nipple. A superficial
cyst at the 7 o'clock position 4 cm from nipple measures
approximately 4 x 4 x 4 mm and likely accounts for the palpable
concern. No suspicious solid mass or abnormal acoustic shadowing is
identified.
IMPRESSION: 1. No mammographic or sonographic evidence of malignancy involving
the LEFT breast.
2. No mammographic evidence of malignancy involving the RIGHT
breast.
3. Multiple benign partially calcified oil cysts in the LOWER INNER
QUADRANT of the LEFT breast, one of which likely accounts for the
palpable concern.

RECOMMENDATION:
Screening mammogram in one year.(Code:2O-[id number])

I have discussed the findings and recommendations with the patient.
If applicable, a reminder letter will be sent to the patient
regarding the next appointment.

BI-RADS CATEGORY  2: Benign.

## 2023-03-17 IMAGING — US US BREAST*L* LIMITED INC AXILLA
1 series · 11 of 11 positions shown · non-contrast
Comparison: Mammography 07/22/2018.

CLINICAL DATA: 39-year-old presenting with a possible pea-sized
palpable lump at the 7-8 o'clock position of the LEFT breast on
recent clinical examination. The patient states that she does not
feel the lump. Annual evaluation, RIGHT breast.

She states that she fell onto a hardwood floor proximally 2 years
ago and injured the inner LEFT breast.
EXAM:
DIGITAL DIAGNOSTIC BILATERAL MAMMOGRAM WITH TOMOSYNTHESIS AND CAD;
ULTRASOUND LEFT BREAST LIMITED
TECHNIQUE: Bilateral digital diagnostic mammography and breast tomosynthesis
was performed. The images were evaluated with computer-aided
detection.; Targeted ultrasound examination of the left breast was
performed.

[Series 1: us breast*left* limited inc axilla · 0.07mm/px · 11 of 11 slices shown]
[im 1/11]
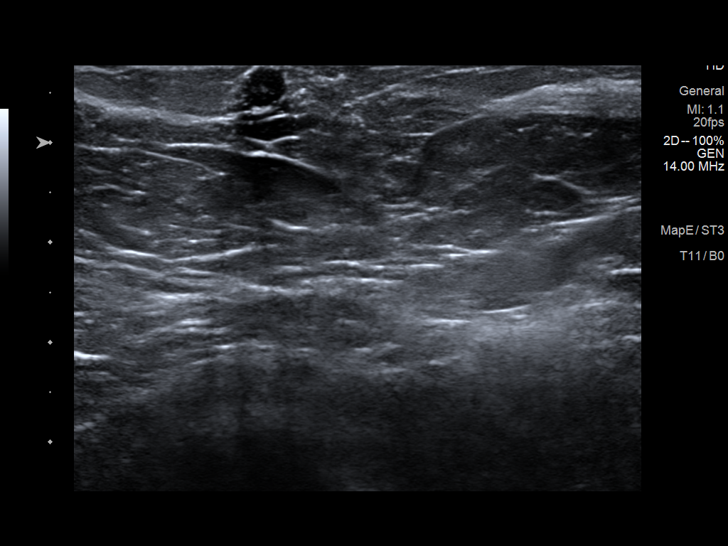
[im 2/11]
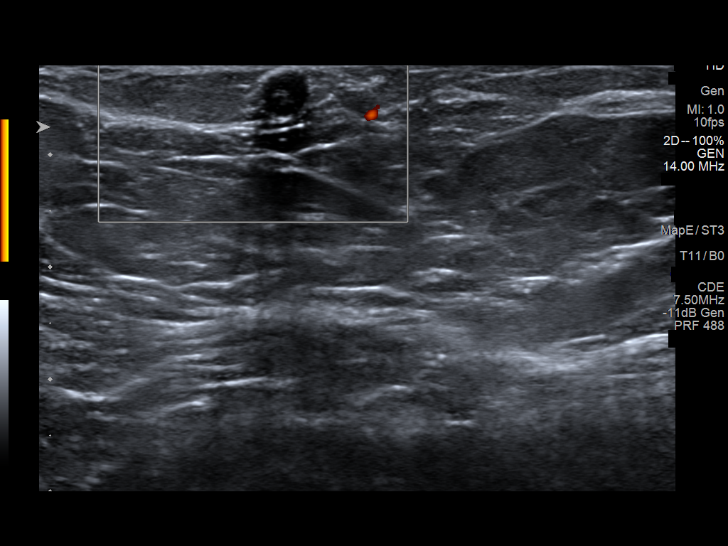
[im 3/11]
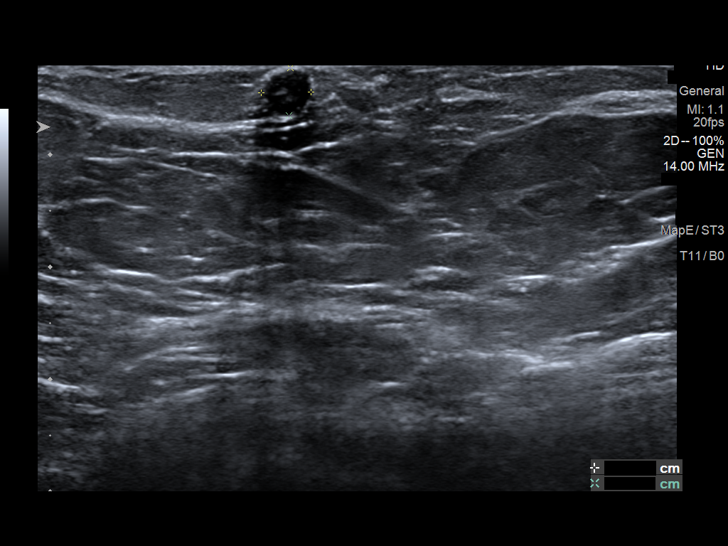
[im 4/11]
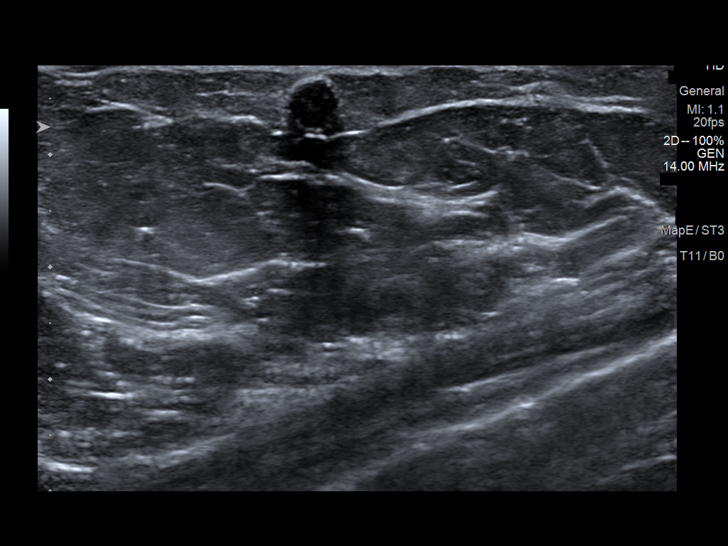
[im 5/11]
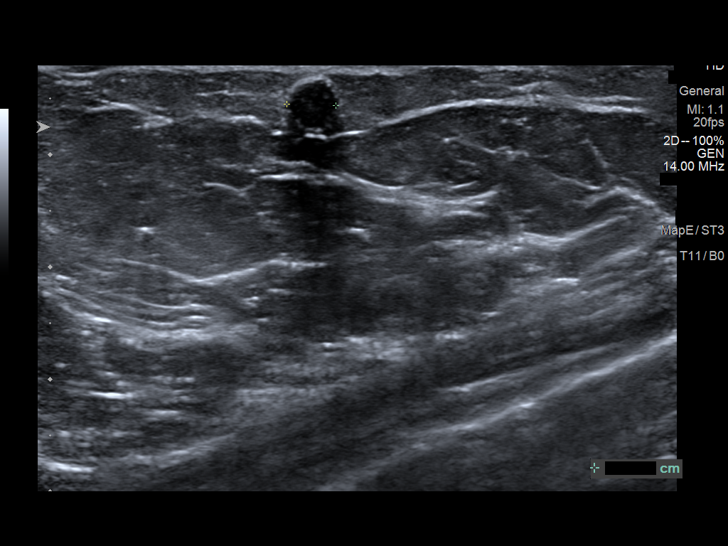
[im 6/11]
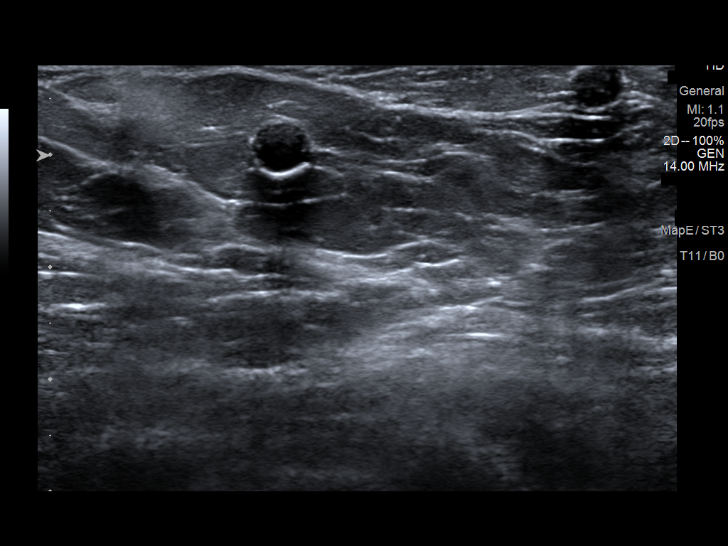
[im 7/11]
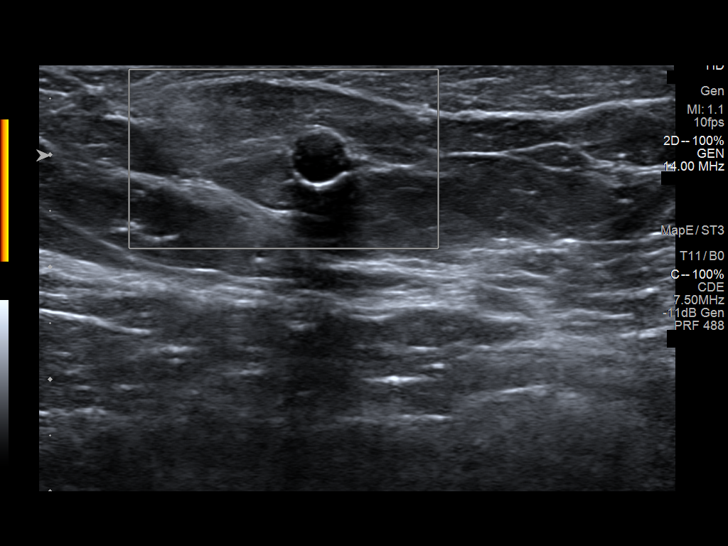
[im 8/11]
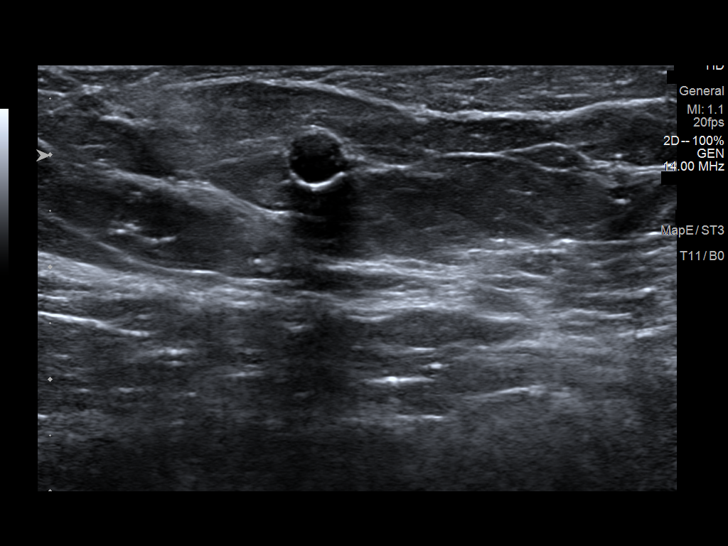
[im 9/11]
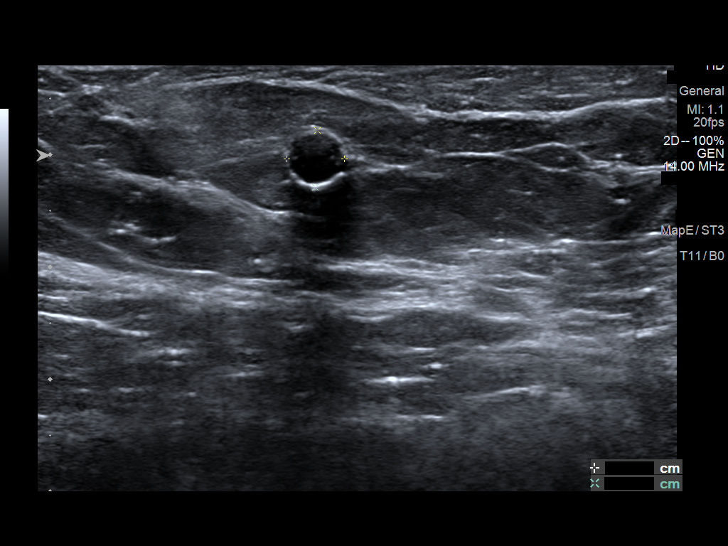
[im 10/11]
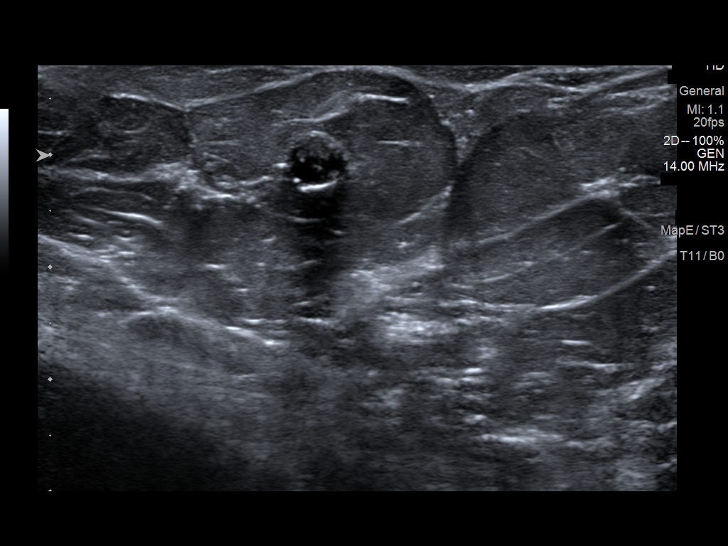
[im 11/11]
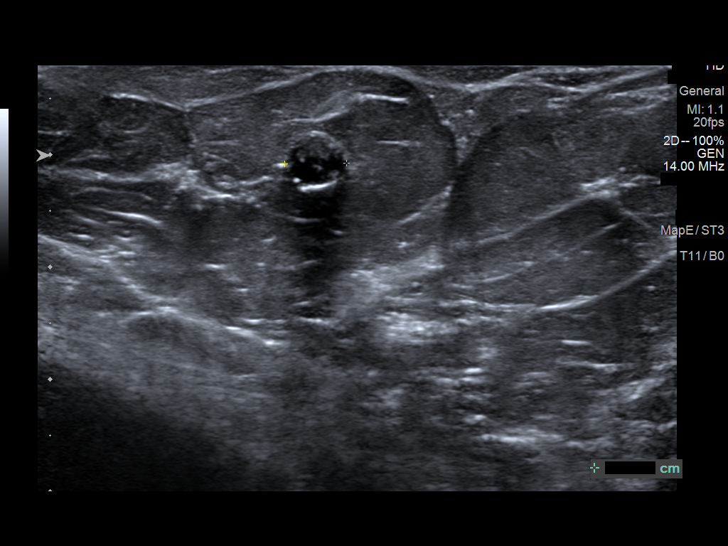

[11 of 11 positions shown; findings below may reference images not displayed]

LEFT breast ultrasound at that
time was performed in a different part of the breast.

ACR Breast Density Category b: There are scattered areas of
fibroglandular density.
FINDINGS: Full field CC and MLO views of both breasts were obtained.

RIGHT: No findings suspicious for malignancy.

LEFT: No findings suspicious for malignancy. Numerous benign
partially calcified oil cysts throughout the lower inner breast
which may account for the palpable concern.

Targeted ultrasound is performed, demonstrating multiple
circumscribed anechoic masses with internal echoes, demonstrating
mixed posterior characteristics and no internal power Doppler flow
at the 7-7:30 o'clock position 4 cm from the nipple. A superficial
cyst at the 7 o'clock position 4 cm from nipple measures
approximately 4 x 4 x 4 mm and likely accounts for the palpable
concern. No suspicious solid mass or abnormal acoustic shadowing is
identified.
IMPRESSION: 1. No mammographic or sonographic evidence of malignancy involving
the LEFT breast.
2. No mammographic evidence of malignancy involving the RIGHT
breast.
3. Multiple benign partially calcified oil cysts in the LOWER INNER
QUADRANT of the LEFT breast, one of which likely accounts for the
palpable concern.

RECOMMENDATION:
Screening mammogram in one year.(Code:[id number]-[id number])

I have discussed the findings and recommendations with the patient.
If applicable, a reminder letter will be sent to the patient
regarding the next appointment.

BI-RADS CATEGORY  2: Benign.

## 2023-06-12 ENCOUNTER — Other Ambulatory Visit: Payer: Self-pay | Admitting: Family Medicine

## 2023-06-12 DIAGNOSIS — Z1231 Encounter for screening mammogram for malignant neoplasm of breast: Secondary | ICD-10-CM

## 2023-07-17 ENCOUNTER — Ambulatory Visit
Admission: RE | Admit: 2023-07-17 | Discharge: 2023-07-17 | Disposition: A | Discharge status: Home / Self Care | Payer: Medicaid Other | Source: Ambulatory Visit | Attending: Family Medicine

## 2023-07-17 DIAGNOSIS — Z1231 Encounter for screening mammogram for malignant neoplasm of breast: Secondary | ICD-10-CM

## 2024-07-06 ENCOUNTER — Other Ambulatory Visit: Payer: Self-pay | Admitting: Family Medicine

## 2024-07-06 DIAGNOSIS — Z1231 Encounter for screening mammogram for malignant neoplasm of breast: Secondary | ICD-10-CM

## 2024-07-21 ENCOUNTER — Ambulatory Visit
Admission: RE | Admit: 2024-07-21 | Discharge: 2024-07-21 | Disposition: A | Discharge status: Home / Self Care | Source: Ambulatory Visit | Attending: Family Medicine | Admitting: Family Medicine

## 2024-07-21 DIAGNOSIS — Z1231 Encounter for screening mammogram for malignant neoplasm of breast: Secondary | ICD-10-CM
